# Patient Record
Sex: Female | Born: 1935 | Race: White | Hispanic: No | Smoking: Never smoker
Health system: Southern US, Community
[De-identification: ages and names within clinical notes are randomized; demographics above are authoritative.]

## PROBLEM LIST (undated history)

## (undated) DIAGNOSIS — F0390 Unspecified dementia without behavioral disturbance: Secondary | ICD-10-CM

## (undated) DIAGNOSIS — M109 Gout, unspecified: Secondary | ICD-10-CM

## (undated) DIAGNOSIS — I10 Essential (primary) hypertension: Secondary | ICD-10-CM

## (undated) DIAGNOSIS — E079 Disorder of thyroid, unspecified: Secondary | ICD-10-CM

## (undated) HISTORY — PX: THYROID SURGERY: SHX805

---

## 2020-02-03 ENCOUNTER — Emergency Department (HOSPITAL_BASED_OUTPATIENT_CLINIC_OR_DEPARTMENT_OTHER): Payer: Medicare Other

## 2020-02-03 ENCOUNTER — Emergency Department (HOSPITAL_BASED_OUTPATIENT_CLINIC_OR_DEPARTMENT_OTHER)
Admission: EM | Admit: 2020-02-03 | Discharge: 2020-02-04 | Disposition: A | Discharge status: Home / Self Care | Payer: Medicare Other | Attending: Emergency Medicine | Admitting: Emergency Medicine

## 2020-02-03 ENCOUNTER — Other Ambulatory Visit: Payer: Self-pay

## 2020-02-03 ENCOUNTER — Encounter (HOSPITAL_BASED_OUTPATIENT_CLINIC_OR_DEPARTMENT_OTHER): Payer: Self-pay | Admitting: *Deleted

## 2020-02-03 DIAGNOSIS — R339 Retention of urine, unspecified: Secondary | ICD-10-CM | POA: Diagnosis not present

## 2020-02-03 DIAGNOSIS — R5382 Chronic fatigue, unspecified: Secondary | ICD-10-CM | POA: Diagnosis present

## 2020-02-03 DIAGNOSIS — F0390 Unspecified dementia without behavioral disturbance: Secondary | ICD-10-CM | POA: Insufficient documentation

## 2020-02-03 DIAGNOSIS — I10 Essential (primary) hypertension: Secondary | ICD-10-CM | POA: Insufficient documentation

## 2020-02-03 DIAGNOSIS — K5900 Constipation, unspecified: Secondary | ICD-10-CM | POA: Insufficient documentation

## 2020-02-03 HISTORY — DX: Disorder of thyroid, unspecified: E07.9

## 2020-02-03 HISTORY — DX: Essential (primary) hypertension: I10

## 2020-02-03 HISTORY — DX: Gout, unspecified: M10.9

## 2020-02-03 HISTORY — DX: Unspecified dementia, unspecified severity, without behavioral disturbance, psychotic disturbance, mood disturbance, and anxiety: F03.90

## 2020-02-03 LAB — CBC WITH DIFFERENTIAL/PLATELET
Abs Immature Granulocytes: 0.13 10*3/uL — ABNORMAL HIGH (ref 0.00–0.07)
Basophils Absolute: 0 10*3/uL (ref 0.0–0.1)
Basophils Relative: 0 %
Eosinophils Absolute: 0.1 10*3/uL (ref 0.0–0.5)
Eosinophils Relative: 1 %
HCT: 42.4 % (ref 36.0–46.0)
Hemoglobin: 13.8 g/dL (ref 12.0–15.0)
Immature Granulocytes: 1 %
Lymphocytes Relative: 17 %
Lymphs Abs: 2 10*3/uL (ref 0.7–4.0)
MCH: 31.9 pg (ref 26.0–34.0)
MCHC: 32.5 g/dL (ref 30.0–36.0)
MCV: 97.9 fL (ref 80.0–100.0)
Monocytes Absolute: 0.9 10*3/uL (ref 0.1–1.0)
Monocytes Relative: 8 %
Neutro Abs: 8.8 10*3/uL — ABNORMAL HIGH (ref 1.7–7.7)
Neutrophils Relative %: 73 %
Platelets: 204 10*3/uL (ref 150–400)
RBC: 4.33 MIL/uL (ref 3.87–5.11)
RDW: 13.7 % (ref 11.5–15.5)
WBC: 11.9 10*3/uL — ABNORMAL HIGH (ref 4.0–10.5)
nRBC: 0 % (ref 0.0–0.2)

## 2020-02-03 LAB — URINALYSIS, ROUTINE W REFLEX MICROSCOPIC
Bilirubin Urine: NEGATIVE
Glucose, UA: NEGATIVE mg/dL
Hgb urine dipstick: NEGATIVE
Ketones, ur: NEGATIVE mg/dL
Leukocytes,Ua: NEGATIVE
Nitrite: NEGATIVE
Protein, ur: NEGATIVE mg/dL
Specific Gravity, Urine: 1.015 (ref 1.005–1.030)
pH: 8 (ref 5.0–8.0)

## 2020-02-03 LAB — COMPREHENSIVE METABOLIC PANEL
ALT: 32 U/L (ref 0–44)
AST: 29 U/L (ref 15–41)
Albumin: 3.4 g/dL — ABNORMAL LOW (ref 3.5–5.0)
Alkaline Phosphatase: 98 U/L (ref 38–126)
Anion gap: 15 (ref 5–15)
BUN: 50 mg/dL — ABNORMAL HIGH (ref 8–23)
CO2: 27 mmol/L (ref 22–32)
Calcium: 8.9 mg/dL (ref 8.9–10.3)
Chloride: 97 mmol/L — ABNORMAL LOW (ref 98–111)
Creatinine, Ser: 1.04 mg/dL — ABNORMAL HIGH (ref 0.44–1.00)
GFR calc Af Amer: 54 mL/min — ABNORMAL LOW (ref >60)
GFR calc non Af Amer: 46 mL/min — ABNORMAL LOW (ref >60)
Glucose, Bld: 94 mg/dL (ref 70–99)
Potassium: 4.1 mmol/L (ref 3.5–5.1)
Sodium: 139 mmol/L (ref 135–145)
Total Bilirubin: 0.9 mg/dL (ref 0.3–1.2)
Total Protein: 7.5 g/dL (ref 6.5–8.1)

## 2020-02-03 LAB — LACTIC ACID, PLASMA: Lactic Acid, Venous: 1.5 mmol/L (ref 0.5–1.9)

## 2020-02-03 LAB — CK: Total CK: 28 U/L — ABNORMAL LOW (ref 38–234)

## 2020-02-03 LAB — CBG MONITORING, ED: Glucose-Capillary: 97 mg/dL (ref 70–99)

## 2020-02-03 LAB — AMMONIA: Ammonia: 12 umol/L (ref 9–35)

## 2020-02-03 MED ORDER — MAGNESIUM CITRATE PO SOLN
ORAL | Status: AC
  Administered 2020-02-03: 1 via ORAL
  Filled 2020-02-03: qty 296

## 2020-02-03 MED ORDER — MAGNESIUM CITRATE PO SOLN: 1.0000 | Freq: Once | ORAL | Status: AC

## 2020-02-03 MED ORDER — SODIUM CHLORIDE 0.9 % IV BOLUS
500.0000 mL | Freq: Once | INTRAVENOUS | Status: AC
  Administered 2020-02-03: 500 mL via INTRAVENOUS

## 2020-02-03 MED ORDER — IOHEXOL 300 MG/ML  SOLN
100.0000 mL | Freq: Once | INTRAMUSCULAR | Status: AC | PRN
  Administered 2020-02-03: 100 mL via INTRAVENOUS

## 2020-02-03 MED ORDER — FLEET PEDIATRIC 3.5-9.5 GM/59ML RE ENEM
ENEMA | RECTAL | Status: AC
  Filled 2020-02-03: qty 1

## 2020-02-03 NOTE — ED Notes (Signed)
After Fleets enema and Soap Suds Enema, only light brown liquid noted in bedside commode, no solid or particles of stool noted

## 2020-02-03 NOTE — ED Notes (Signed)
Returns from CT scan, safety measures remain in place with sr x 2 up in stretcher, bed in lowest position, daughter at bedside, placed back on cont cardiac monitoring, with NSR noted with frequent unifocal PVCs

## 2020-02-03 NOTE — ED Triage Notes (Signed)
Pt co urinary freq and generalized weakness x 4 days

## 2020-02-03 NOTE — ED Notes (Signed)
Family states she has been having very frequent urination, states has had pneumonia recently, not taking in PO fluids, is very weak

## 2020-02-03 NOTE — Discharge Instructions (Addendum)
Contact your doctor's office in the morning to arrange follow-up. Continue to work on magnesium citrate. Return to ER for worsening or concerning symptoms.

## 2020-02-03 NOTE — ED Provider Notes (Signed)
[facility] EMERGENCY DEPARTMENT Provider Note   CSN: [id number] Arrival date & time: 02/03/20  1552     History Chief Complaint  Patient presents with  . Fatigue    Jean Sherman is a 86 y.o. female.  86 year old female with history of dementia, HTN, thyroid disease and gout brought in by daughter from home for sleeping more today and urinary frequency without significant odor. Patient was treated for pneumonia about a month and a half ago, treated for gout with prednisone and completed treatment 4 days ago. No history of diabetes, no advance directives. Per daughter, patient is acting at baseline just sleeping more. Level 5 caveat applies to his patient who is unable to provide any history on her own.        Past Medical History:  Diagnosis Date  . Dementia (HCC)   . Gout   . Hypertension   . Thyroid disease     There are no problems to display for this patient.   Past Surgical History:  Procedure Laterality Date  . CESAREAN SECTION    . THYROID SURGERY       OB History   No obstetric history on file.     No family history on file.  Social History   Tobacco Use  . Smoking status: Never Smoker  . Smokeless tobacco: Never Used  Substance Use Topics  . Alcohol use: Not on file  . Drug use: Not Currently    Home Medications Prior to Admission medications   Not on File    Allergies    Patient has no known allergies.  Review of Systems   Review of Systems  Unable to perform ROS: Dementia    Physical Exam Updated Vital Signs BP (!) 158/86 (BP Location: Right Arm)   Pulse 75   Temp 98 F (36.7 C) (Oral)   Resp 15   Ht 5\' 2"  (1.575 m)   Wt 43.1 kg   SpO2 99%   BMI 17.38 kg/m   Physical Exam Vitals and nursing note reviewed.  Constitutional:      General: She is not in acute distress.    Appearance: She is not ill-appearing or toxic-appearing.     Comments: Sleeping, arouses to touch with confused verbal response  HENT:      Head: Normocephalic and atraumatic.     Nose: Nose normal.     Mouth/Throat:     Mouth: Mucous membranes are dry.  Eyes:     Conjunctiva/sclera: Conjunctivae normal.     Pupils: Pupils are equal, round, and reactive to light.  Cardiovascular:     Rate and Rhythm: Normal rate and regular rhythm.     Pulses: Normal pulses.     Heart sounds: Normal heart sounds.  Pulmonary:     Effort: Pulmonary effort is normal.     Breath sounds: Normal breath sounds.  Abdominal:     General: There is distension.     Tenderness: There is abdominal tenderness.     Comments: Lower abdomen appears distended, firm to the touch however patient comes very upset with any palpation of the abdomen and exam is limited.  Musculoskeletal:        General: No swelling, tenderness or deformity.     Cervical back: Neck supple.     Right lower leg: No edema.     Left lower leg: No edema.  Lymphadenopathy:     Cervical: No cervical adenopathy.  Skin:    General: Skin is  warm and dry.     Findings: No erythema or rash.  Neurological:     Mental Status: Mental status is at baseline.     ED Results / Procedures / Treatments   Labs (all labs ordered are listed, but only abnormal results are displayed) Labs Reviewed  CBC WITH DIFFERENTIAL/PLATELET - Abnormal; Notable for the following components:      Result Value   WBC 11.9 (*)    Neutro Abs 8.8 (*)    Abs Immature Granulocytes 0.13 (*)    All other components within normal limits  COMPREHENSIVE METABOLIC PANEL - Abnormal; Notable for the following components:   Chloride 97 (*)    BUN 50 (*)    Creatinine, Ser 1.04 (*)    Albumin 3.4 (*)    GFR calc non Af Amer 46 (*)    GFR calc Af Amer 54 (*)    All other components within normal limits  CK - Abnormal; Notable for the following components:   Total CK 28 (*)    All other components within normal limits  URINALYSIS, ROUTINE W REFLEX MICROSCOPIC  AMMONIA  LACTIC ACID, PLASMA  CBG MONITORING, ED     EKG EKG Interpretation  Date/Time:  Monday February 03 2020 16:45:05 EDT Ventricular Rate:  72 PR Interval:    QRS Duration: 106 QT Interval:  417 QTC Calculation: 457 R Axis:   -47 Text Interpretation: Sinus rhythm Ventricular trigeminy LAD, consider left anterior fascicular block Left ventricular hypertrophy Anterior Q waves, possibly due to LVH No previous ECGs available Confirmed by Vanetta Mulders [phone]) on 02/03/2020 5:46:49 PM   Radiology CT Head Wo Contrast  Result Date: 02/03/2020 CLINICAL DATA:  86 year old female with altered mental status. EXAM: CT HEAD WITHOUT CONTRAST TECHNIQUE: Contiguous axial images were obtained from the base of the skull through the vertex without intravenous contrast. COMPARISON:  None. FINDINGS: Evaluation of this exam is limited due to motion artifact. Brain: Moderate age-related atrophy and chronic microvascular ischemic changes. There is no acute intracranial hemorrhage. No mass effect or midline shift. No extra-axial fluid collection. Vascular: No hyperdense vessel or unexpected calcification. Skull: Normal. Negative for fracture or focal lesion. Sinuses/Orbits: No acute finding. Other: None IMPRESSION: 1. No acute intracranial hemorrhage. 2. Moderate age-related atrophy and chronic microvascular ischemic changes. Electronically Signed   By: Elgie Collard M.D.   On: 02/03/2020 17:43   CT Abdomen Pelvis W Contrast  Result Date: 02/03/2020 CLINICAL DATA:  Abdominal pain. EXAM: CT ABDOMEN AND PELVIS WITH CONTRAST TECHNIQUE: Multidetector CT imaging of the abdomen and pelvis was performed using the standard protocol following bolus administration of intravenous contrast. CONTRAST:  OMNIPAQUE IOHEXOL 300 MG/ML  SOLN COMPARISON:  None. FINDINGS: Lower chest: Mild atelectasis and/or infiltrate is seen within the anterior aspect of the right lung base. There is mild cardiomegaly. Hepatobiliary: No focal liver abnormality is seen. No gallstones,  gallbladder wall thickening, or biliary dilatation. Pancreas: Unremarkable. No pancreatic ductal dilatation or surrounding inflammatory changes. Spleen: Normal in size without focal abnormality. Adrenals/Urinary Tract: Adrenal glands are unremarkable. Kidneys are normal in size, without focal lesions. There is moderate severity bilateral hydronephrosis and hydroureter. The urinary bladder is markedly distended and otherwise normal in appearance. Stomach/Bowel: There is a small hiatal hernia. The appendix is not clearly identified. An extensive amount of stool is seen within the distal sigmoid colon and rectum. Moderate severity diffuse rectal wall thickening is seen. No evidence of bowel wall thickening or inflammatory changes. Vascular/Lymphatic: There is moderate  to marked severity calcification of the abdominal aorta and bilateral common iliac arteries. No enlarged abdominal or pelvic lymph nodes. Reproductive: Uterus and bilateral adnexa are unremarkable. Other: No abdominal wall hernia or abnormality. No abdominopelvic ascites. Musculoskeletal: A chronic fracture deformity is seen involving the left inferior pubic ramus. There is moderate severity levoscoliosis of the lumbar spine with multilevel degenerative changes. IMPRESSION: 1. Markedly distended urinary bladder with moderate severity bilateral hydronephrosis and hydroureter. 2. Large amount of stool within the distal sigmoid colon and rectum with moderate severity diffuse rectal wall thickening. While this may represent sequelae associated with focal colitis, correlation with follow-up is recommended to exclude the presence of an underlying neoplastic process. 3. Small hiatal hernia. 4. Mild atelectasis and/or infiltrate within the anterior aspect of the right lung base. 5. Mild cardiomegaly. 6. Chronic fracture deformity involving the left inferior pubic ramus. 7. Aortic atherosclerosis. Aortic Atherosclerosis (ICD10-I70.0). Electronically Signed   By:  Aram Candela M.D.   On: 02/03/2020 17:57    Procedures Procedures (including critical care time)  Medications Ordered in ED Medications  sodium chloride 0.9 % bolus 500 mL (0 mLs Intravenous Stopped 02/03/20 1832)  iohexol (OMNIPAQUE) 300 MG/ML solution 100 mL (100 mLs Intravenous Contrast Given 02/03/20 1739)  sodium phosphate Pediatric (FLEET) 3.5-9.5 GM/59ML enema (  Given 02/03/20 2004)  magnesium citrate solution 1 Bottle (1 Bottle Oral Given 02/03/20 2236)    ED Course  I have reviewed the triage vital signs and the nursing notes.  Pertinent labs & imaging results that were available during my care of the patient were reviewed by me and considered in my medical decision making (see chart for details).  Clinical Course as of Feb 02 2337  Mon Feb 03, 2020  3577 86 year old female with dementia brought in by daughter from home for sleeping more often and urinary frequency.  On exam, patient is found to have a distended abdomen which is firm to the touch.  Work-up completed for concerns for altered mental status, CT head without acute findings.  CT abdomen pelvis shows significantly distended bladder and stool retention.  Labs reassuring including no significant changes to CBC, CMP, urinalysis, CK, lactic acid and ammonia.  Vital signs remained stable throughout stay.  Foley catheter was placed and removed nearly 1200 mL of urine.  Attempted digital disimpaction however stool is soft and brown and unable to be removed.  Attempted Fleet enema without success.  Attempted soapsuds enema which did not produce significant stool output.  Patient has been slowly working on magnesium citrate. Patient has 24-hour in-home health care, plan at this point is to discharge home with Foley in place, continue with magnesium citrate.  Follow-up with PCP tomorrow, return to ER as needed.   [LM]    Clinical Course User Index [LM] Alden Hipp   MDM Rules/Calculators/A&P                           Final Clinical Impression(s) / ED Diagnoses Final diagnoses:  Urinary retention  Constipation, unspecified constipation type    Rx / DC Orders ED Discharge Orders    None       Jeannie Fend, PA-C 02/03/20 2338    Vanetta Mulders, MD 02/10/20 [phone]

## 2020-02-04 NOTE — ED Notes (Signed)
AVS reviewed and given to daughter, urinary catheter was remaining in place per ED providers orders, discussed urinary catheter care and how to empty bag, daughter was able to return demonstration. Opportunity for questions provided, assisted pt to exit and into private vehicle

## 2020-11-30 IMAGING — CT CT ABD-PELV W/ CM
2 of 5 series · 15 of 46 positions shown, 17 images · IV contrast (Omnipaque)
Comparison: None.

CLINICAL DATA: Abdominal pain.

EXAM:
CT ABDOMEN AND PELVIS WITH CONTRAST
TECHNIQUE: Multidetector CT imaging of the abdomen and pelvis was performed
using the standard protocol following bolus administration of
intravenous contrast.
CONTRAST:  100mL OMNIPAQUE IOHEXOL 300 MG/ML  SOLN

[Series 2: axial st · axial · 0.66mm/px · z∈[-638,-258]mm · 12 of 86 slices shown, 14 images]
[im 5/86  soft-tissue]
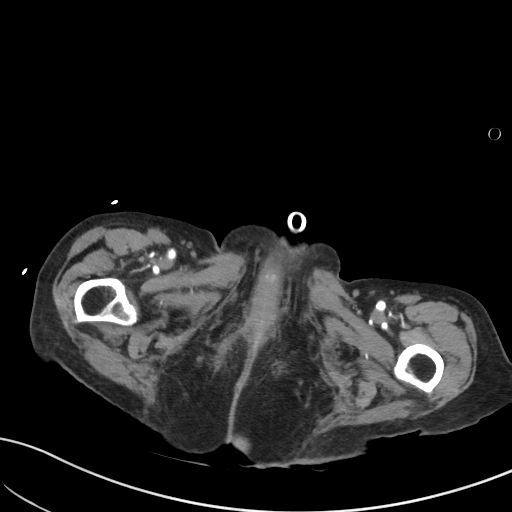
[im 5/86  bone]
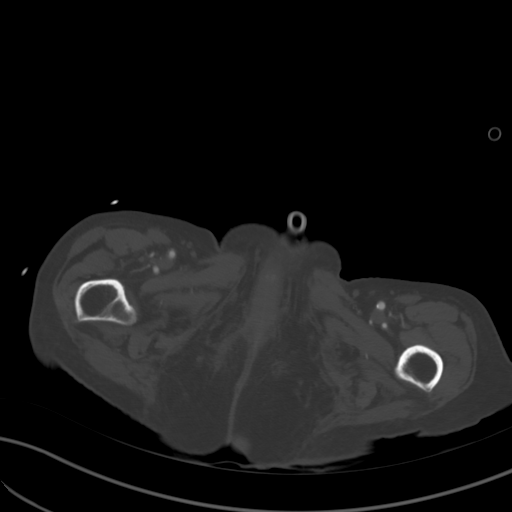
[im 14/86  soft-tissue]
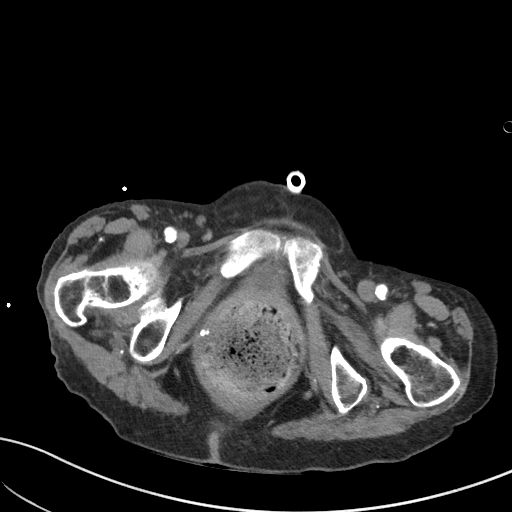
[im 18/86  soft-tissue]
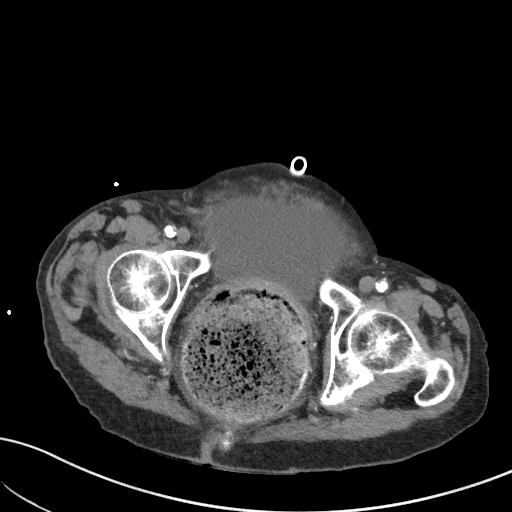
[im 27/86  soft-tissue]
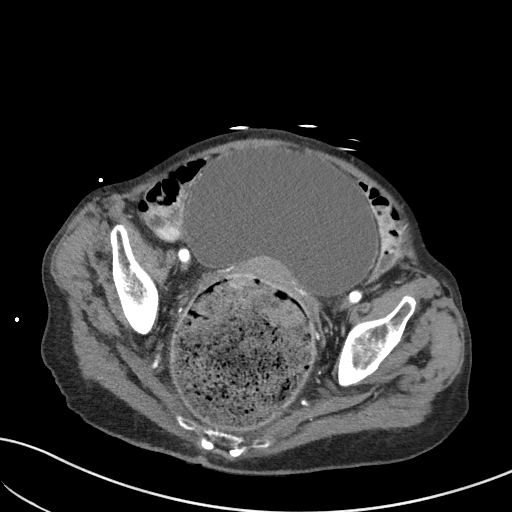
[im 32/86  soft-tissue]
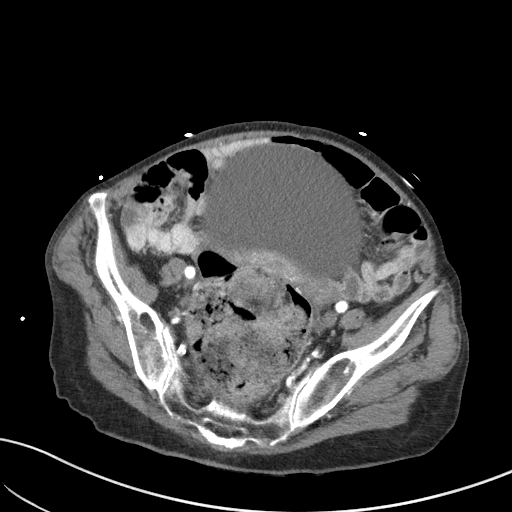
[im 41/86  soft-tissue]
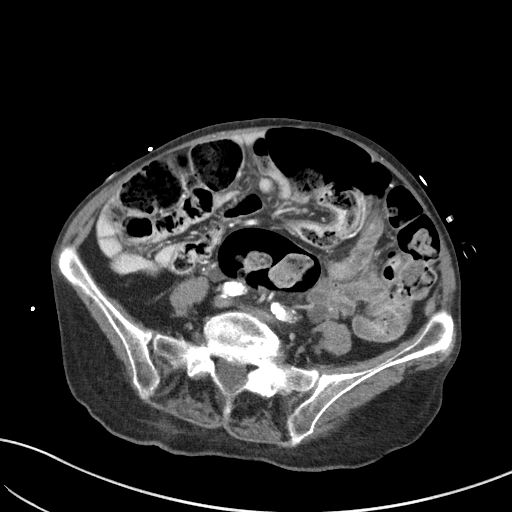
[im 45/86  soft-tissue]
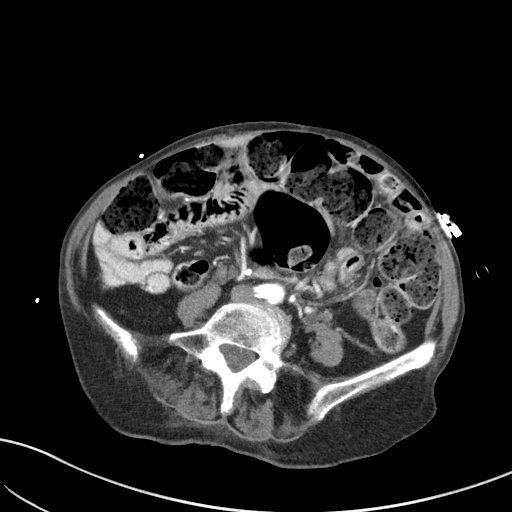
[im 54/86  soft-tissue]
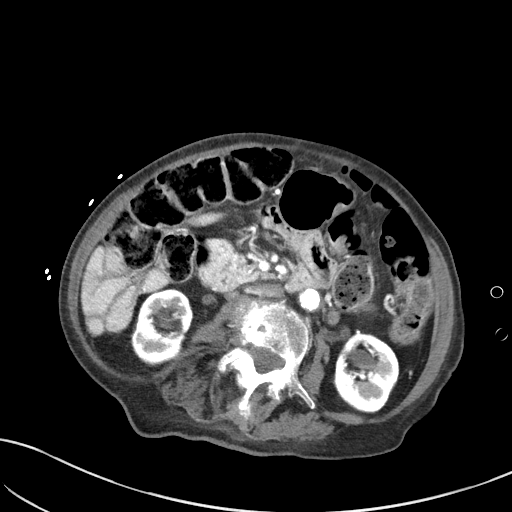
[im 59/86  soft-tissue]
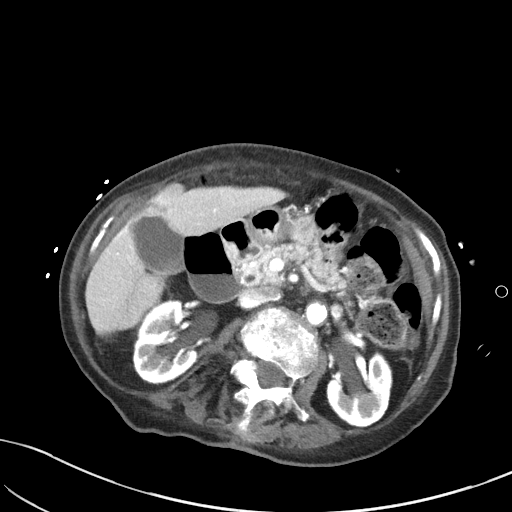
[im 59/86  bone]
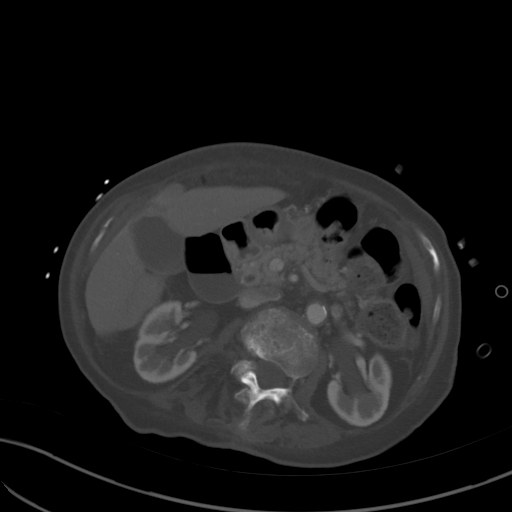
[im 68/86  soft-tissue]
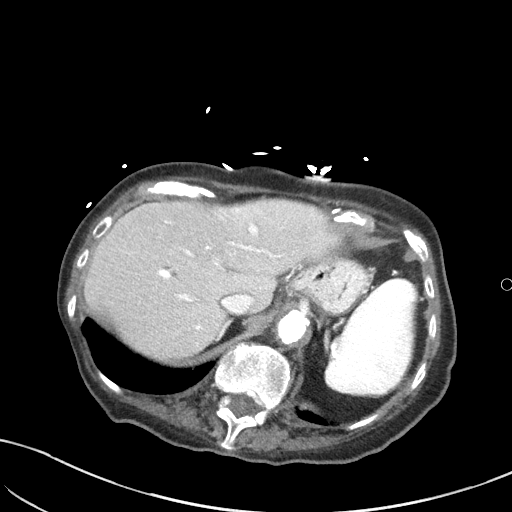
[im 72/86  soft-tissue]
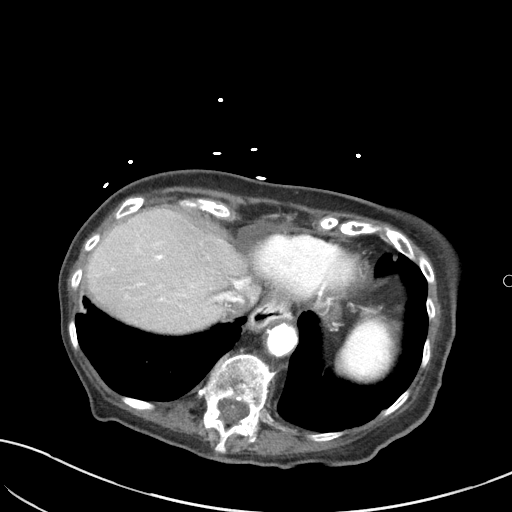
[im 81/86  soft-tissue]
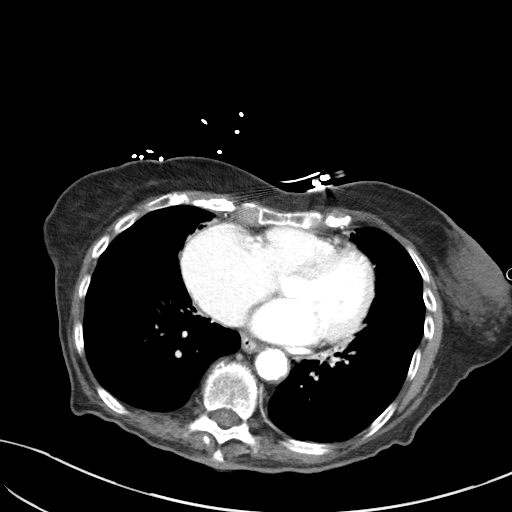

[Series 5: coronal st · coronal · 0.58mm/px · 3 of 77 slices shown]
[im 26/77  soft-tissue]
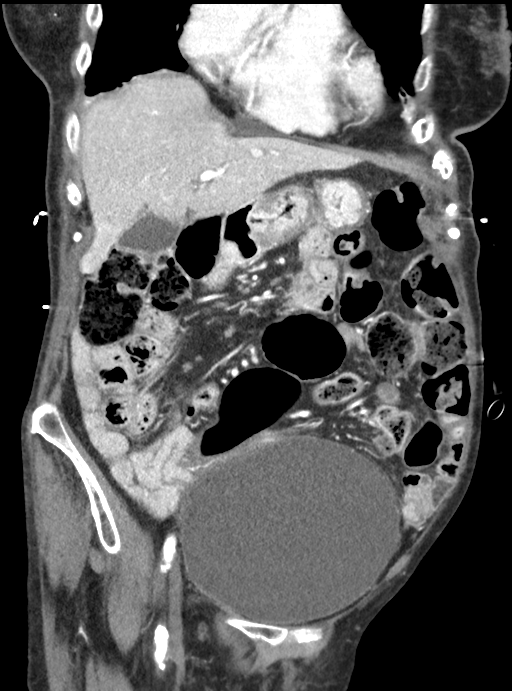
[im 34/77  soft-tissue]
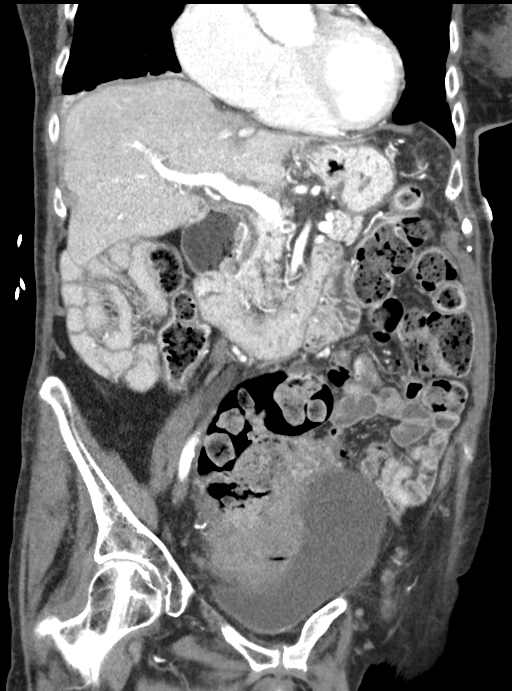
[im 43/77  soft-tissue]
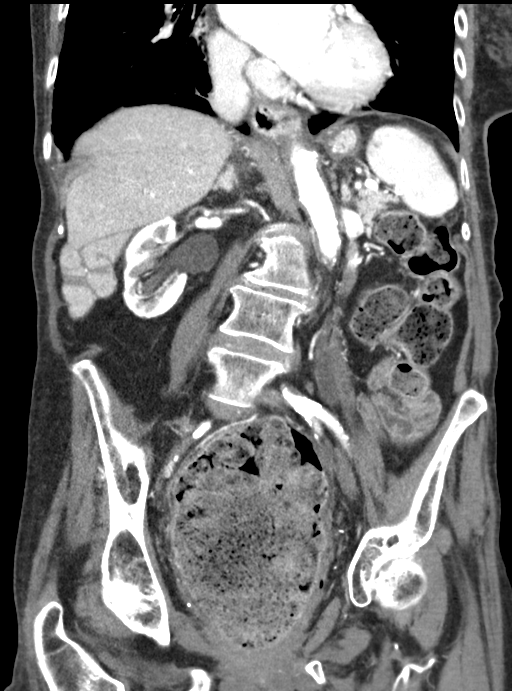

[15 of 46 positions shown; findings below may reference images not displayed]

FINDINGS: Lower chest: Mild atelectasis and/or infiltrate is seen within the
anterior aspect of the right lung base.

There is mild cardiomegaly.

Hepatobiliary: No focal liver abnormality is seen. No gallstones,
gallbladder wall thickening, or biliary dilatation.

Pancreas: Unremarkable. No pancreatic ductal dilatation or
surrounding inflammatory changes.

Spleen: Normal in size without focal abnormality.

Adrenals/Urinary Tract: Adrenal glands are unremarkable. Kidneys are
normal in size, without focal lesions. There is moderate severity
bilateral hydronephrosis and hydroureter. The urinary bladder is
markedly distended and otherwise normal in appearance.

Stomach/Bowel: There is a small hiatal hernia. The appendix is not
clearly identified. An extensive amount of stool is seen within the
distal sigmoid colon and rectum. Moderate severity diffuse rectal
wall thickening is seen. No evidence of bowel wall thickening or
inflammatory changes.

Vascular/Lymphatic: There is moderate to marked severity
calcification of the abdominal aorta and bilateral common iliac
arteries. No enlarged abdominal or pelvic lymph nodes.

Reproductive: Uterus and bilateral adnexa are unremarkable.

Other: No abdominal wall hernia or abnormality. No abdominopelvic
ascites.

Musculoskeletal: A chronic fracture deformity is seen involving the
left inferior pubic ramus.

There is moderate severity levoscoliosis of the lumbar spine with
multilevel degenerative changes.
IMPRESSION: 1. Markedly distended urinary bladder with moderate severity
bilateral hydronephrosis and hydroureter.
2. Large amount of stool within the distal sigmoid colon and rectum
with moderate severity diffuse rectal wall thickening. While this
may represent sequelae associated with focal colitis, correlation
with follow-up is recommended to exclude the presence of an
underlying neoplastic process.
3. Small hiatal hernia.
4. Mild atelectasis and/or infiltrate within the anterior aspect of
the right lung base.
5. Mild cardiomegaly.
6. Chronic fracture deformity involving the left inferior pubic
ramus.
7. Aortic atherosclerosis.

Aortic Atherosclerosis ([id number]).

## 2022-01-01 DEATH — deceased
